# Patient Record
Sex: Female | Born: 1980 | Race: Black or African American | Hispanic: No | Marital: Single | Smoking: Current every day smoker
Health system: Southern US, Community
[De-identification: ages and names within clinical notes are randomized; demographics above are authoritative.]

## PROBLEM LIST (undated history)

## (undated) DIAGNOSIS — E049 Nontoxic goiter, unspecified: Secondary | ICD-10-CM

## (undated) DIAGNOSIS — I499 Cardiac arrhythmia, unspecified: Secondary | ICD-10-CM

## (undated) DIAGNOSIS — N809 Endometriosis, unspecified: Secondary | ICD-10-CM

---

## 2000-10-08 ENCOUNTER — Emergency Department (HOSPITAL_COMMUNITY): Admission: EM | Admit: 2000-10-08 | Discharge: 2000-10-08 | Payer: Self-pay | Admitting: Emergency Medicine

## 2004-07-10 ENCOUNTER — Emergency Department (HOSPITAL_COMMUNITY): Admission: EM | Admit: 2004-07-10 | Discharge: 2004-07-10 | Payer: Self-pay | Admitting: Emergency Medicine

## 2007-06-25 ENCOUNTER — Emergency Department (HOSPITAL_COMMUNITY): Admission: EM | Admit: 2007-06-25 | Discharge: 2007-06-25 | Payer: Self-pay | Admitting: Emergency Medicine

## 2009-11-03 ENCOUNTER — Emergency Department (HOSPITAL_COMMUNITY): Admission: EM | Admit: 2009-11-03 | Discharge: 2009-11-03 | Payer: Self-pay | Admitting: Emergency Medicine

## 2011-01-03 ENCOUNTER — Emergency Department (HOSPITAL_COMMUNITY)
Admission: EM | Admit: 2011-01-03 | Discharge: 2011-01-03 | Disposition: A | Discharge status: Home / Self Care | Payer: BC Managed Care – PPO | Attending: Emergency Medicine | Admitting: Emergency Medicine

## 2011-01-03 ENCOUNTER — Emergency Department (HOSPITAL_COMMUNITY): Payer: BC Managed Care – PPO

## 2011-01-03 DIAGNOSIS — Y9383 Activity, rough housing and horseplay: Secondary | ICD-10-CM | POA: Insufficient documentation

## 2011-01-03 DIAGNOSIS — S6390XA Sprain of unspecified part of unspecified wrist and hand, initial encounter: Secondary | ICD-10-CM | POA: Insufficient documentation

## 2011-01-03 DIAGNOSIS — S6990XA Unspecified injury of unspecified wrist, hand and finger(s), initial encounter: Secondary | ICD-10-CM | POA: Insufficient documentation

## 2011-01-03 DIAGNOSIS — Y92009 Unspecified place in unspecified non-institutional (private) residence as the place of occurrence of the external cause: Secondary | ICD-10-CM | POA: Insufficient documentation

## 2011-01-03 DIAGNOSIS — M79609 Pain in unspecified limb: Secondary | ICD-10-CM | POA: Insufficient documentation

## 2011-01-03 DIAGNOSIS — IMO0002 Reserved for concepts with insufficient information to code with codable children: Secondary | ICD-10-CM | POA: Insufficient documentation

## 2011-01-03 DIAGNOSIS — S6980XA Other specified injuries of unspecified wrist, hand and finger(s), initial encounter: Secondary | ICD-10-CM | POA: Insufficient documentation

## 2011-01-03 DIAGNOSIS — F319 Bipolar disorder, unspecified: Secondary | ICD-10-CM | POA: Insufficient documentation

## 2015-07-22 ENCOUNTER — Other Ambulatory Visit: Payer: Self-pay | Admitting: Obstetrics and Gynecology

## 2015-08-12 ENCOUNTER — Inpatient Hospital Stay (HOSPITAL_COMMUNITY): Admission: RE | Admit: 2015-08-12 | Payer: BLUE CROSS/BLUE SHIELD | Source: Ambulatory Visit

## 2015-08-12 ENCOUNTER — Inpatient Hospital Stay (HOSPITAL_COMMUNITY)
Admission: RE | Admit: 2015-08-12 | Discharge: 2015-08-12 | Disposition: A | Discharge status: Home / Self Care | Payer: Self-pay | Source: Ambulatory Visit

## 2015-08-12 NOTE — Patient Instructions (Addendum)
Your procedure is scheduled on: August 14, 2015  Enter through the Main Entrance of [facility] at: 11:30 am   Pick up the phone at the desk and dial [phone].  Call this number if you have problems the morning of surgery: [phone].  Remember: Do NOT eat food: after midnight on Wednesday  Do NOT drink clear liquids after:  9:00am day of surgery  Take these medicines the morning of surgery with a SIP OF WATER: none   Do NOT wear jewelry (body piercing), metal hair clips/bobby pins, make-up, or nail polish. Do NOT wear lotions, powders, or perfumes.  You may wear deoderant. Do NOT shave for 48 hours prior to surgery. Do NOT bring valuables to the hospital. Contacts, dentures, or bridgework may not be worn into surgery. Leave suitcase in car.  After surgery it may be brought to your room.  For patients admitted to the hospital, checkout time is 11:00 AM the day of discharge.

## 2015-08-13 ENCOUNTER — Encounter (HOSPITAL_COMMUNITY): Payer: Self-pay

## 2015-08-13 ENCOUNTER — Encounter (HOSPITAL_COMMUNITY)
Admission: RE | Admit: 2015-08-13 | Discharge: 2015-08-13 | Disposition: A | Discharge status: Home / Self Care | Payer: BLUE CROSS/BLUE SHIELD | Source: Ambulatory Visit | Attending: Obstetrics and Gynecology | Admitting: Obstetrics and Gynecology

## 2015-08-13 HISTORY — DX: Endometriosis, unspecified: N80.9

## 2015-08-13 HISTORY — DX: Cardiac arrhythmia, unspecified: I49.9

## 2015-08-13 HISTORY — DX: Nontoxic goiter, unspecified: E04.9

## 2015-08-13 LAB — CBC
HEMATOCRIT: 37.1 % (ref 36.0–46.0)
Hemoglobin: 12.3 g/dL (ref 12.0–15.0)
MCH: 26.9 pg (ref 26.0–34.0)
MCHC: 33.2 g/dL (ref 30.0–36.0)
MCV: 81.2 fL (ref 78.0–100.0)
PLATELETS: 232 10*3/uL (ref 150–400)
RBC: 4.57 MIL/uL (ref 3.87–5.11)
RDW: 14.1 % (ref 11.5–15.5)
WBC: 9 10*3/uL (ref 4.0–10.5)

## 2015-08-13 LAB — ABO/RH: ABO/RH(D): O POS

## 2015-08-13 NOTE — Patient Instructions (Signed)
Your procedure is scheduled on:  Tomorrow, Dec. 1, 2016  Enter through the [employer] [facility] at:  11:30 A.M.  Pick up the phone at the desk and dial [phone].  Call this number if you have problems the morning of surgery: [phone].  Remember: Do NOT eat food:  After midnight tonight Do NOT drink clear liquids after:  9:00 A.M. Day of surgery Take these medicines the morning of surgery with a SIP OF WATER:  None  Do NOT wear jewelry (body piercing), metal hair clips/bobby pins, make-up, or nail polish. Do NOT wear lotions, powders, or perfumes.  You may wear deoderant. Do NOT shave for 48 hours prior to surgery. Do NOT bring valuables to the hospital. Contacts, dentures, or bridgework may not be worn into surgery. Leave suitcase in car.  After surgery it may be brought to your room.  For patients admitted to the hospital, checkout time is 11:00 AM the day of discharge.

## 2015-08-14 ENCOUNTER — Encounter (HOSPITAL_COMMUNITY): Payer: Self-pay | Admitting: *Deleted

## 2015-08-14 ENCOUNTER — Inpatient Hospital Stay (HOSPITAL_COMMUNITY): Payer: BLUE CROSS/BLUE SHIELD | Admitting: Anesthesiology

## 2015-08-14 ENCOUNTER — Inpatient Hospital Stay (HOSPITAL_COMMUNITY)
Admission: RE | Admit: 2015-08-14 | Discharge: 2015-08-17 | DRG: 337 | Disposition: A | Discharge status: Home / Self Care | Payer: BLUE CROSS/BLUE SHIELD | Source: Ambulatory Visit | Attending: Obstetrics and Gynecology | Admitting: Obstetrics and Gynecology

## 2015-08-14 ENCOUNTER — Encounter (HOSPITAL_COMMUNITY)
Admission: RE | Disposition: A | Discharge status: Home / Self Care | Payer: Self-pay | Source: Ambulatory Visit | Attending: Obstetrics and Gynecology

## 2015-08-14 DIAGNOSIS — K660 Peritoneal adhesions (postprocedural) (postinfection): Secondary | ICD-10-CM | POA: Diagnosis present

## 2015-08-14 DIAGNOSIS — N803 Endometriosis of pelvic peritoneum: Secondary | ICD-10-CM | POA: Diagnosis present

## 2015-08-14 DIAGNOSIS — N83202 Unspecified ovarian cyst, left side: Secondary | ICD-10-CM | POA: Diagnosis present

## 2015-08-14 DIAGNOSIS — N83201 Unspecified ovarian cyst, right side: Secondary | ICD-10-CM | POA: Diagnosis present

## 2015-08-14 DIAGNOSIS — N736 Female pelvic peritoneal adhesions (postinfective): Secondary | ICD-10-CM | POA: Diagnosis present

## 2015-08-14 DIAGNOSIS — K36 Other appendicitis: Secondary | ICD-10-CM | POA: Diagnosis present

## 2015-08-14 DIAGNOSIS — D259 Leiomyoma of uterus, unspecified: Secondary | ICD-10-CM | POA: Diagnosis present

## 2015-08-14 DIAGNOSIS — Z9049 Acquired absence of other specified parts of digestive tract: Secondary | ICD-10-CM

## 2015-08-14 HISTORY — PX: APPENDECTOMY: SHX54

## 2015-08-14 HISTORY — PX: LAPAROTOMY: SHX154

## 2015-08-14 LAB — CBC
HEMATOCRIT: 30.1 % — AB (ref 36.0–46.0)
HEMOGLOBIN: 9.7 g/dL — AB (ref 12.0–15.0)
MCH: 26.6 pg (ref 26.0–34.0)
MCHC: 32.2 g/dL (ref 30.0–36.0)
MCV: 82.5 fL (ref 78.0–100.0)
Platelets: 215 10*3/uL (ref 150–400)
RBC: 3.65 MIL/uL — ABNORMAL LOW (ref 3.87–5.11)
RDW: 14 % (ref 11.5–15.5)
WBC: 16.1 10*3/uL — ABNORMAL HIGH (ref 4.0–10.5)

## 2015-08-14 LAB — PREPARE RBC (CROSSMATCH)

## 2015-08-14 SURGERY — APPENDECTOMY
Anesthesia: General | Site: Abdomen

## 2015-08-14 MED ORDER — BUPIVACAINE HCL (PF) 0.25 % IJ SOLN
INTRAMUSCULAR | Status: AC
  Filled 2015-08-14: qty 30

## 2015-08-14 MED ORDER — DEXAMETHASONE SODIUM PHOSPHATE 4 MG/ML IJ SOLN
INTRAMUSCULAR | Status: DC | PRN
  Administered 2015-08-14: 10 mg via INTRAVENOUS

## 2015-08-14 MED ORDER — SCOPOLAMINE 1 MG/3DAYS TD PT72
MEDICATED_PATCH | TRANSDERMAL | Status: AC
  Administered 2015-08-14: 1.5 mg via TRANSDERMAL
  Filled 2015-08-14: qty 1

## 2015-08-14 MED ORDER — PROMETHAZINE HCL 25 MG/ML IJ SOLN: 6.2500 mg | INTRAMUSCULAR | Status: DC | PRN

## 2015-08-14 MED ORDER — FENTANYL CITRATE (PF) 250 MCG/5ML IJ SOLN
INTRAMUSCULAR | Status: AC
  Filled 2015-08-14: qty 5

## 2015-08-14 MED ORDER — SCOPOLAMINE 1 MG/3DAYS TD PT72
1.0000 | MEDICATED_PATCH | Freq: Once | TRANSDERMAL | Status: DC
  Administered 2015-08-14: 1.5 mg via TRANSDERMAL

## 2015-08-14 MED ORDER — GLYCOPYRROLATE 0.2 MG/ML IJ SOLN
INTRAMUSCULAR | Status: DC | PRN
  Administered 2015-08-14: 0.1 mg via INTRAVENOUS
  Administered 2015-08-14: 0.6 mg via INTRAVENOUS

## 2015-08-14 MED ORDER — LIDOCAINE HCL (CARDIAC) 20 MG/ML IV SOLN
INTRAVENOUS | Status: DC | PRN
  Administered 2015-08-14: 100 mg via INTRAVENOUS

## 2015-08-14 MED ORDER — MIDAZOLAM HCL 2 MG/2ML IJ SOLN
INTRAMUSCULAR | Status: DC | PRN
  Administered 2015-08-14: 2 mg via INTRAVENOUS

## 2015-08-14 MED ORDER — NALOXONE HCL 0.4 MG/ML IJ SOLN: 0.4000 mg | INTRAMUSCULAR | Status: DC | PRN

## 2015-08-14 MED ORDER — SIMETHICONE 80 MG PO CHEW
80.0000 mg | CHEWABLE_TABLET | Freq: Four times a day (QID) | ORAL | Status: DC | PRN
  Administered 2015-08-15 – 2015-08-16 (×2): 80 mg via ORAL
  Filled 2015-08-14 (×2): qty 1

## 2015-08-14 MED ORDER — ACETAMINOPHEN 10 MG/ML IV SOLN
1000.0000 mg | Freq: Once | INTRAVENOUS | Status: AC
  Administered 2015-08-14: 1000 mg via INTRAVENOUS
  Filled 2015-08-14: qty 100

## 2015-08-14 MED ORDER — HYDROMORPHONE HCL 1 MG/ML IJ SOLN: 0.2000 mg | INTRAMUSCULAR | Status: DC | PRN

## 2015-08-14 MED ORDER — NEOSTIGMINE METHYLSULFATE 10 MG/10ML IV SOLN
INTRAVENOUS | Status: DC | PRN
  Administered 2015-08-14: 3 mg via INTRAVENOUS

## 2015-08-14 MED ORDER — FENTANYL CITRATE (PF) 100 MCG/2ML IJ SOLN: INTRAMUSCULAR | Status: DC | PRN

## 2015-08-14 MED ORDER — ONDANSETRON HCL 4 MG/2ML IJ SOLN: 4.0000 mg | Freq: Four times a day (QID) | INTRAMUSCULAR | Status: DC | PRN

## 2015-08-14 MED ORDER — HYDROMORPHONE HCL 1 MG/ML IJ SOLN
INTRAMUSCULAR | Status: DC | PRN
  Administered 2015-08-14 (×4): 1 mg via INTRAVENOUS

## 2015-08-14 MED ORDER — LACTATED RINGERS IV SOLN
INTRAVENOUS | Status: DC
  Administered 2015-08-14: 125 mL/h via INTRAVENOUS
  Administered 2015-08-14: 20:00:00 via INTRAVENOUS

## 2015-08-14 MED ORDER — ONDANSETRON HCL 4 MG/2ML IJ SOLN
INTRAMUSCULAR | Status: DC | PRN
  Administered 2015-08-14: 4 mg via INTRAVENOUS

## 2015-08-14 MED ORDER — LIDOCAINE HCL (CARDIAC) 20 MG/ML IV SOLN
INTRAVENOUS | Status: AC
  Filled 2015-08-14: qty 5

## 2015-08-14 MED ORDER — CEFAZOLIN SODIUM-DEXTROSE 2-3 GM-% IV SOLR
2.0000 g | INTRAVENOUS | Status: AC
  Administered 2015-08-14: 2 g via INTRAVENOUS

## 2015-08-14 MED ORDER — LACTATED RINGERS IV SOLN
INTRAVENOUS | Status: DC
  Administered 2015-08-14 (×5): via INTRAVENOUS

## 2015-08-14 MED ORDER — ONDANSETRON HCL 4 MG PO TABS: 4.0000 mg | ORAL_TABLET | Freq: Four times a day (QID) | ORAL | Status: DC | PRN

## 2015-08-14 MED ORDER — DIPHENHYDRAMINE HCL 50 MG/ML IJ SOLN: 12.5000 mg | Freq: Four times a day (QID) | INTRAMUSCULAR | Status: DC | PRN

## 2015-08-14 MED ORDER — NEOSTIGMINE METHYLSULFATE 10 MG/10ML IV SOLN
INTRAVENOUS | Status: AC
  Filled 2015-08-14: qty 1

## 2015-08-14 MED ORDER — ROCURONIUM BROMIDE 100 MG/10ML IV SOLN
INTRAVENOUS | Status: DC | PRN
  Administered 2015-08-14 (×3): 10 mg via INTRAVENOUS
  Administered 2015-08-14: 5 mg via INTRAVENOUS
  Administered 2015-08-14: 10 mg via INTRAVENOUS
  Administered 2015-08-14: 35 mg via INTRAVENOUS

## 2015-08-14 MED ORDER — PROPOFOL 10 MG/ML IV BOLUS
INTRAVENOUS | Status: AC
Start: 2015-08-14 — End: 2015-08-14
  Filled 2015-08-14: qty 20

## 2015-08-14 MED ORDER — PROPOFOL 10 MG/ML IV BOLUS
INTRAVENOUS | Status: DC | PRN
  Administered 2015-08-14: 200 mg via INTRAVENOUS

## 2015-08-14 MED ORDER — HYDROMORPHONE HCL 1 MG/ML IJ SOLN
INTRAMUSCULAR | Status: AC
  Filled 2015-08-14: qty 1

## 2015-08-14 MED ORDER — MEPERIDINE HCL 25 MG/ML IJ SOLN: 6.2500 mg | INTRAMUSCULAR | Status: DC | PRN

## 2015-08-14 MED ORDER — FENTANYL CITRATE (PF) 100 MCG/2ML IJ SOLN
INTRAMUSCULAR | Status: DC | PRN
  Administered 2015-08-14: 100 ug via INTRAVENOUS

## 2015-08-14 MED ORDER — PANTOPRAZOLE SODIUM 40 MG PO TBEC
40.0000 mg | DELAYED_RELEASE_TABLET | Freq: Every day | ORAL | Status: DC
  Administered 2015-08-15 – 2015-08-16 (×2): 40 mg via ORAL
  Filled 2015-08-14 (×2): qty 1

## 2015-08-14 MED ORDER — DEXAMETHASONE SODIUM PHOSPHATE 10 MG/ML IJ SOLN
INTRAMUSCULAR | Status: AC
  Filled 2015-08-14: qty 1

## 2015-08-14 MED ORDER — FENTANYL CITRATE (PF) 100 MCG/2ML IJ SOLN
INTRAMUSCULAR | Status: AC
  Filled 2015-08-14: qty 2

## 2015-08-14 MED ORDER — SODIUM CHLORIDE 0.9 % IJ SOLN: 9.0000 mL | INTRAMUSCULAR | Status: DC | PRN

## 2015-08-14 MED ORDER — CEFAZOLIN SODIUM-DEXTROSE 2-3 GM-% IV SOLR
INTRAVENOUS | Status: AC
  Filled 2015-08-14: qty 50

## 2015-08-14 MED ORDER — HYDROMORPHONE HCL 1 MG/ML IJ SOLN
INTRAMUSCULAR | Status: AC
  Filled 2015-08-14: qty 2

## 2015-08-14 MED ORDER — VASOPRESSIN 20 UNIT/ML IV SOLN
INTRAVENOUS | Status: AC
  Filled 2015-08-14: qty 1

## 2015-08-14 MED ORDER — HYDROMORPHONE HCL 1 MG/ML IJ SOLN
0.2500 mg | INTRAMUSCULAR | Status: DC | PRN
  Administered 2015-08-14: 0.25 mg via INTRAVENOUS
  Administered 2015-08-14 (×2): 0.5 mg via INTRAVENOUS
  Administered 2015-08-14: 0.25 mg via INTRAVENOUS

## 2015-08-14 MED ORDER — METOCLOPRAMIDE HCL 5 MG/ML IJ SOLN
INTRAMUSCULAR | Status: DC | PRN
  Administered 2015-08-14: 10 mg via INTRAVENOUS

## 2015-08-14 MED ORDER — METOCLOPRAMIDE HCL 5 MG/ML IJ SOLN
INTRAMUSCULAR | Status: AC
  Filled 2015-08-14: qty 2

## 2015-08-14 MED ORDER — MIDAZOLAM HCL 2 MG/2ML IJ SOLN
INTRAMUSCULAR | Status: AC
  Filled 2015-08-14: qty 2

## 2015-08-14 MED ORDER — HYDROMORPHONE 1 MG/ML IV SOLN
INTRAVENOUS | Status: DC
  Administered 2015-08-14: 20:00:00 via INTRAVENOUS
  Administered 2015-08-14: 2.6 mg via INTRAVENOUS
  Administered 2015-08-15: 3 mg via INTRAVENOUS
  Administered 2015-08-15: 2.4 mg via INTRAVENOUS
  Filled 2015-08-14: qty 25

## 2015-08-14 MED ORDER — GLYCOPYRROLATE 0.2 MG/ML IJ SOLN
INTRAMUSCULAR | Status: AC
  Filled 2015-08-14: qty 3

## 2015-08-14 MED ORDER — DEXTROSE IN LACTATED RINGERS 5 % IV SOLN
INTRAVENOUS | Status: DC
  Administered 2015-08-15: 04:00:00 via INTRAVENOUS

## 2015-08-14 MED ORDER — ROCURONIUM BROMIDE 100 MG/10ML IV SOLN
INTRAVENOUS | Status: AC
  Filled 2015-08-14: qty 1

## 2015-08-14 MED ORDER — HYDROMORPHONE HCL 1 MG/ML IJ SOLN
INTRAMUSCULAR | Status: AC
  Administered 2015-08-14: 0.5 mg via INTRAVENOUS
  Filled 2015-08-14: qty 1

## 2015-08-14 MED ORDER — ONDANSETRON HCL 4 MG/2ML IJ SOLN
INTRAMUSCULAR | Status: AC
  Filled 2015-08-14: qty 2

## 2015-08-14 MED ORDER — DIPHENHYDRAMINE HCL 12.5 MG/5ML PO ELIX
12.5000 mg | ORAL_SOLUTION | Freq: Four times a day (QID) | ORAL | Status: DC | PRN
  Administered 2015-08-15: 12.5 mg via ORAL
  Filled 2015-08-14: qty 5

## 2015-08-14 MED ORDER — FENTANYL CITRATE (PF) 250 MCG/5ML IJ SOLN
INTRAMUSCULAR | Status: DC | PRN
  Administered 2015-08-14: 100 ug via INTRAVENOUS
  Administered 2015-08-14 (×2): 50 ug via INTRAVENOUS
  Administered 2015-08-14 (×2): 100 ug via INTRAVENOUS
  Administered 2015-08-14 (×2): 50 ug via INTRAVENOUS

## 2015-08-14 MED ORDER — MENTHOL 3 MG MT LOZG
1.0000 | LOZENGE | OROMUCOSAL | Status: DC | PRN
  Administered 2015-08-15: 3 mg via ORAL
  Filled 2015-08-14: qty 9

## 2015-08-14 MED ORDER — VASOPRESSIN 20 UNIT/ML IV SOLN
INTRAVENOUS | Status: AC
  Filled 2015-08-14: qty 3

## 2015-08-14 MED ORDER — SODIUM CHLORIDE 0.9 % IJ SOLN
INTRAMUSCULAR | Status: AC
  Filled 2015-08-14: qty 100

## 2015-08-14 MED ORDER — DEXTROSE IN LACTATED RINGERS 5 % IV SOLN: INTRAVENOUS | Status: DC

## 2015-08-14 MED ORDER — BUPIVACAINE HCL (PF) 0.25 % IJ SOLN
INTRAMUSCULAR | Status: DC | PRN
  Administered 2015-08-14: 10 mL

## 2015-08-14 SURGICAL SUPPLY — 54 items
APL SKNCLS STERI-STRIP NONHPOA (GAUZE/BANDAGES/DRESSINGS) ×3
BARRIER ADHS 3X4 INTERCEED (GAUZE/BANDAGES/DRESSINGS) ×5 IMPLANT
BENZOIN TINCTURE PRP APPL 2/3 (GAUZE/BANDAGES/DRESSINGS) ×4 IMPLANT
BRR ADH 4X3 ABS CNTRL BYND (GAUZE/BANDAGES/DRESSINGS) ×3
CANISTER SUCT 3000ML (MISCELLANEOUS) ×5 IMPLANT
CLIP TI MEDIUM 6 (CLIP) ×8 IMPLANT
CLOSURE WOUND 1/2 X4 (GAUZE/BANDAGES/DRESSINGS) ×1
CLOTH BEACON ORANGE TIMEOUT ST (SAFETY) ×5 IMPLANT
CONT PATH 16OZ SNAP LID 3702 (MISCELLANEOUS) ×5 IMPLANT
CONT SPEC PATH 64OZ SNAP LID (MISCELLANEOUS) ×5 IMPLANT
DECANTER SPIKE VIAL GLASS SM (MISCELLANEOUS) ×9 IMPLANT
DRAPE CESAREAN BIRTH W POUCH (DRAPES) ×5 IMPLANT
DRSG OPSITE POSTOP 4X10 (GAUZE/BANDAGES/DRESSINGS) ×5 IMPLANT
DURAPREP 26ML APPLICATOR (WOUND CARE) ×5 IMPLANT
ELECT NDL TIP 2.8 STRL (NEEDLE) ×1 IMPLANT
ELECT NEEDLE TIP 2.8 STRL (NEEDLE) ×5 IMPLANT
FILTER STRAW FLUID ASPIR (MISCELLANEOUS) IMPLANT
GAUZE SPONGE 4X4 16PLY XRAY LF (GAUZE/BANDAGES/DRESSINGS) ×5 IMPLANT
GLOVE BIOGEL PI IND STRL 7.0 (GLOVE) ×9 IMPLANT
GLOVE BIOGEL PI INDICATOR 7.0 (GLOVE) ×6
GLOVE ECLIPSE 6.5 STRL STRAW (GLOVE) ×5 IMPLANT
GOWN STRL REUS W/TWL LRG LVL3 (GOWN DISPOSABLE) ×15 IMPLANT
NDL SPNL 22GX3.5 QUINCKE BK (NEEDLE) ×1 IMPLANT
NEEDLE HYPO 22GX1.5 SAFETY (NEEDLE) ×5 IMPLANT
NEEDLE SPNL 22GX3.5 QUINCKE BK (NEEDLE) ×5 IMPLANT
NS IRRIG 1000ML POUR BTL (IV SOLUTION) ×5 IMPLANT
PACK ABDOMINAL GYN (CUSTOM PROCEDURE TRAY) ×5 IMPLANT
PAD OB MATERNITY 4.3X12.25 (PERSONAL CARE ITEMS) ×5 IMPLANT
RETRACTOR WND ALEXIS 25 LRG (MISCELLANEOUS) ×2 IMPLANT
RTRCTR WOUND ALEXIS 25CM LRG (MISCELLANEOUS) ×5
SPONGE GAUZE 4X4 12PLY STER LF (GAUZE/BANDAGES/DRESSINGS) ×5 IMPLANT
STAPLER GUN LINEAR PROX 60 (STAPLE) ×4 IMPLANT
STAPLER VISISTAT 35W (STAPLE) IMPLANT
STRIP CLOSURE SKIN 1/2X4 (GAUZE/BANDAGES/DRESSINGS) ×3 IMPLANT
SURGIFLO W/THROMBIN 8M KIT (HEMOSTASIS) ×4 IMPLANT
SUT MON AB 3-0 SH 27 (SUTURE) ×15
SUT MON AB 3-0 SH27 (SUTURE) ×9 IMPLANT
SUT MON AB 4-0 PS1 27 (SUTURE) ×10 IMPLANT
SUT PLAIN 2 0 (SUTURE) ×5
SUT PLAIN ABS 2-0 CT1 27XMFL (SUTURE) ×2 IMPLANT
SUT SILK 3 0 SH CR/8 (SUTURE) ×4 IMPLANT
SUT VIC AB 0 CT1 18XCR BRD8 (SUTURE) ×3 IMPLANT
SUT VIC AB 0 CT1 36 (SUTURE) ×10 IMPLANT
SUT VIC AB 0 CT1 8-18 (SUTURE) ×5
SUT VIC AB 2-0 CT1 27 (SUTURE) ×5
SUT VIC AB 2-0 CT1 TAPERPNT 27 (SUTURE) ×3 IMPLANT
SUT VIC AB 2-0 SH 27 (SUTURE)
SUT VIC AB 2-0 SH 27XBRD (SUTURE) IMPLANT
SUT VIC AB 4-0 PS2 27 (SUTURE) IMPLANT
SUT VICRYL 0 TIES 12 18 (SUTURE) ×4 IMPLANT
SUT VICRYL 4-0 PS2 18IN ABS (SUTURE) ×4 IMPLANT
SYR CONTROL 10ML LL (SYRINGE) ×6 IMPLANT
TOWEL OR 17X24 6PK STRL BLUE (TOWEL DISPOSABLE) ×10 IMPLANT
TRAY FOLEY CATH SILVER 14FR (SET/KITS/TRAYS/PACK) ×5 IMPLANT

## 2015-08-14 NOTE — Op Note (Signed)
Preoperative Diagnosis: severe intra-abdominal adhesions and probable chronic appendicitis  Postoprative Diagnosis: same  Procedure: Procedure(s): Intraoperative consult, extensive lysis of pelvic adhesions, appendectomy   Surgeon: Excell Seltzer T   Assistants: Garwin Brothers  Anesthesia:  General endotracheal anesthesia  Indications: I was consulted intraoperatively by Dr. Garwin Brothers at the time of planned myomectomy and ovarian cystectomy due to severe bowel adhesions in the pelvis.    Procedure Detail: the patient had been explored through a Pfannenstiel incision with adequate exposure of the pelvis and lower abdomen. There was noted to be a large fibroid uterus as well as bilateral ovarian cysts. Initially apparent were dense adhesions of the cecum and terminal ileum to the right adnexa and down into the cul-de-sac. Using careful sharp dissection the cecum and terminal ileum were dissected away from the right adnexa and with further dissection there was an obvious dense adhesion of the appendix down into the cul-de-sac on the right side. The cecum and terminal ileum were completely freed down to the tip of the cecum and I exposed the base of the appendix adjacent to the ovary and tube. Using then mostly blunt dissection the appendix was mobilized up out of the pelvis completely freeing the terminal ileum and cecum. The appendix was distinctly abnormal, thickened and foreshortened with some thickening or mass effect extending back into the tip of the cecum. Appendectomy was performed dividing what remained of the mesial appendix between clamps and tied with 2-0 silk ties and completely freeing the appendix down to the tip of the cecum. I was unable to place a TA 60 blue load stapler across the cecum proximal to the thickened area but still clearly staying away from the ileocecal valve. This was used to remove the appendix and the tip of the cecum with the staple line intact and without bleeding.  Attention was then turned to the sigmoid and rectosigmoid which was somewhat adherent to the left adnexa. I divided lateral peritoneal attachments above the area of adhesions and the distal sigmoid dissected up out of the retroperitoneum. The ureter was identified and protected. More superficial adhesions to the adnexa and also to the back of the uterus were then sharply divided. As we dissected distally the rectum was very adherent to the back of the uterus with obliteration of the cul-de-sac. With very slow tedious dissection the rectum was able to be separated from the uterus working down toward the cervix. I then entered a fairly soft area in the midline and the rectum was dissected away from the lower uterus and cervix particular on the left side although there remained some very difficult adhesions very low on the uterus on the right side adjacent to where the appendix had been adherent in the cul-de-sac. At this point I felt that taking these down further with limited visibility behind the enlarged uterus with entail some risk and Dr. Garwin Brothers felt that she had enough exposure that she could adequately proceed with her planned procedure. I sure there was no bleeding. I carefully examined the rectosigmoid and I did not see any evidence of injury. Total time for the lysis of adhesions only was approximately 1 hour. Dr. Garwin Brothers then proceeded with her planned procedure.    Findings: As above  Estimated Blood Loss:  200 mL         Drains: none  Blood Given: none          Specimens: appendix        Complications:  * No complications entered in OR  log *         Disposition: PACU - hemodynamically stable.         Condition: stable

## 2015-08-14 NOTE — Transfer of Care (Signed)
Immediate Anesthesia Transfer of Care Note  Patient: Amber Conley  Procedure(s) Performed: Procedure(s): APPENDECTOMY (N/A) EXPLORATORY LAPAROTOMY WITH LYSIS OF ADHESIONS (N/A)  Patient Location: PACU  Anesthesia Type:General  Level of Consciousness: awake, alert  and oriented  Airway & Oxygen Therapy: Patient Spontanous Breathing and Patient connected to nasal cannula oxygen  Post-op Assessment: Report given to RN and Post -op Vital signs reviewed and stable  Post vital signs: Reviewed and stable  Last Vitals:  Filed Vitals:   08/14/15 1135  BP: 119/94  Pulse: 92  Temp: 36.6 C  Resp: 20    Complications: No apparent anesthesia complications

## 2015-08-14 NOTE — Anesthesia Preprocedure Evaluation (Signed)
Anesthesia Evaluation  Patient identified by MRN, date of birth, ID band Patient awake    Reviewed: Allergy & Precautions, NPO status , Patient's Chart, lab work & pertinent test results  Airway Mallampati: II  TM Distance: >3 FB Neck ROM: Full    Dental   Pulmonary Current Smoker,    breath sounds clear to auscultation       Cardiovascular + dysrhythmias  Rhythm:Regular Rate:Normal     Neuro/Psych negative neurological ROS  negative psych ROS   GI/Hepatic negative GI ROS, Neg liver ROS,   Endo/Other  negative endocrine ROS  Renal/GU negative Renal ROS  negative genitourinary   Musculoskeletal negative musculoskeletal ROS (+)   Abdominal   Peds  Hematology negative hematology ROS (+)   Anesthesia Other Findings   Reproductive/Obstetrics negative OB ROS                             Lab Results  Component Value Date   WBC 9.0 08/13/2015   HGB 12.3 08/13/2015   HCT 37.1 08/13/2015   MCV 81.2 08/13/2015   PLT 232 08/13/2015   No results found for: CREATININE, BUN, NA, K, CL, CO2 No results found for: INR, PROTIME  Anesthesia Physical Anesthesia Plan  ASA: II  Anesthesia Plan: General   Post-op Pain Management:    Induction: Intravenous  Airway Management Planned: Oral ETT  Additional Equipment:   Intra-op Plan:   Post-operative Plan: Extubation in OR  Informed Consent: I have reviewed the patients History and Physical, chart, labs and discussed the procedure including the risks, benefits and alternatives for the proposed anesthesia with the patient or authorized representative who has indicated his/her understanding and acceptance.   Dental advisory given  Plan Discussed with: CRNA  Anesthesia Plan Comments:         Anesthesia Quick Evaluation

## 2015-08-14 NOTE — Brief Op Note (Signed)
08/14/2015  5:35 PM  PATIENT:  Amber Conley  34 y.o. female  PRE-OPERATIVE DIAGNOSIS:  Fibroid Uterus, Dysmenorrhea, Bilateral Complex Ovarian Cysts  POST-OPERATIVE DIAGNOSIS:  Stage IV pelvic endometriosis, chronic appendicitis, fibroid uterus  PROCEDURE:  Procedure(s): APPENDECTOMY (N/A) EXPLORATORY LAPAROTOMY WITH LYSIS OF ADHESIONS (N/A)  SURGEON:  Surgeon(s) and Role:    * Servando Salina, MD - Primary    * Excell Seltzer, MD - Assisting  PHYSICIAN ASSISTANT:   ASSISTANTS: Artelia Laroche, CNM   ANESTHESIA:   general  Findings: obliterated cul des sac, abnl appendix adherent to right adnexa, fibroid uterus, left ovary adherent to left fundal area and l;eft side wall, right ovary adherent to right pelvic side wall, left ureter seen EBL:  Total I/O In: 4800 [I.V.:4800] Out: 1550 [Urine:200; Blood:1350]  BLOOD ADMINISTERED:none  DRAINS: none   LOCAL MEDICATIONS USED:  MARCAINE     SPECIMEN:  Source of Specimen:  appendix  DISPOSITION OF SPECIMEN:  PATHOLOGY  COUNTS:  YES  TOURNIQUET:  * No tourniquets in log *  DICTATION: .Other Dictation: Dictation Number [id number]  PLAN OF CARE: Admit to inpatient   PATIENT DISPOSITION:  PACU - hemodynamically stable.   Delay start of Pharmacological VTE agent (>24hrs) due to surgical blood loss or risk of bleeding: no

## 2015-08-14 NOTE — Anesthesia Postprocedure Evaluation (Signed)
Anesthesia Post Note  Patient: [name]  Procedure(s) Performed: Procedure(s) (LRB): APPENDECTOMY (N/A) EXPLORATORY LAPAROTOMY WITH LYSIS OF ADHESIONS (N/A)  Patient location during evaluation: [facility] Anesthesia Type: General Level of consciousness: awake Pain management: pain level controlled Vital Signs Assessment: post-procedure vital signs reviewed and stable Respiratory status: spontaneous breathing Cardiovascular status: stable Postop Assessment: no signs of nausea or vomiting and adequate PO intake Anesthetic complications: no    Last Vitals:  Filed Vitals:   08/14/15 1915 08/14/15 1930  BP: 108/69 119/78  Pulse: 106 94  Temp: 37.3 C   Resp: 17 22    Last Pain:  Filed Vitals:   08/14/15 1934  PainSc: Amber Conley

## 2015-08-14 NOTE — Anesthesia Procedure Notes (Signed)
Procedure Name: Intubation Date/Time: 08/14/2015 1:22 PM Performed by: Flossie Dibble Pre-anesthesia Checklist: Patient identified, Timeout performed, Emergency Drugs available, Suction available and Patient being monitored Patient Re-evaluated:Patient Re-evaluated prior to inductionOxygen Delivery Method: Circle system utilized Preoxygenation: Pre-oxygenation with 100% oxygen Intubation Type: IV induction Ventilation: Mask ventilation without difficulty Laryngoscope Size: Mac and 3 Grade View: Grade I Tube type: Oral Tube size: 7.0 mm Number of attempts: 1 Airway Equipment and Method: Stylet Placement Confirmation: ETT inserted through vocal cords under direct vision,  positive ETCO2 and breath sounds checked- equal and bilateral Secured at: 21 cm Tube secured with: Tape Dental Injury: Teeth and Oropharynx as per pre-operative assessment

## 2015-08-15 ENCOUNTER — Encounter (HOSPITAL_COMMUNITY): Payer: Self-pay | Admitting: Obstetrics and Gynecology

## 2015-08-15 LAB — BASIC METABOLIC PANEL
ANION GAP: 5 (ref 5–15)
BUN: 9 mg/dL (ref 6–20)
CALCIUM: 8.4 mg/dL — AB (ref 8.9–10.3)
CO2: 26 mmol/L (ref 22–32)
Chloride: 107 mmol/L (ref 101–111)
Creatinine, Ser: 0.79 mg/dL (ref 0.44–1.00)
Glucose, Bld: 147 mg/dL — ABNORMAL HIGH (ref 65–99)
Potassium: 3.9 mmol/L (ref 3.5–5.1)
Sodium: 138 mmol/L (ref 135–145)

## 2015-08-15 LAB — CBC
HCT: 25.3 % — ABNORMAL LOW (ref 36.0–46.0)
Hemoglobin: 8.4 g/dL — ABNORMAL LOW (ref 12.0–15.0)
MCH: 27.2 pg (ref 26.0–34.0)
MCHC: 33.2 g/dL (ref 30.0–36.0)
MCV: 81.9 fL (ref 78.0–100.0)
PLATELETS: 171 10*3/uL (ref 150–400)
RBC: 3.09 MIL/uL — AB (ref 3.87–5.11)
RDW: 13.9 % (ref 11.5–15.5)
WBC: 9.6 10*3/uL (ref 4.0–10.5)

## 2015-08-15 MED ORDER — NICOTINE 21 MG/24HR TD PT24
21.0000 mg | MEDICATED_PATCH | Freq: Every day | TRANSDERMAL | Status: DC
  Administered 2015-08-16: 21 mg via TRANSDERMAL
  Filled 2015-08-15 (×4): qty 1

## 2015-08-15 MED ORDER — ALPRAZOLAM 0.25 MG PO TABS
0.2500 mg | ORAL_TABLET | Freq: Three times a day (TID) | ORAL | Status: DC | PRN
  Administered 2015-08-15 – 2015-08-17 (×3): 0.25 mg via ORAL
  Filled 2015-08-15 (×3): qty 1

## 2015-08-15 MED ORDER — ACETAMINOPHEN-CODEINE #3 300-30 MG PO TABS
2.0000 | ORAL_TABLET | ORAL | Status: DC | PRN
  Administered 2015-08-15 – 2015-08-16 (×9): 2 via ORAL
  Administered 2015-08-17: 1 via ORAL
  Administered 2015-08-17: 2 via ORAL
  Filled 2015-08-15 (×3): qty 2
  Filled 2015-08-15 (×2): qty 1
  Filled 2015-08-15 (×3): qty 2
  Filled 2015-08-15: qty 1
  Filled 2015-08-15 (×3): qty 2

## 2015-08-15 NOTE — Op Note (Signed)
Amber Conley, Amber Conley              ACCOUNT NO.:  [id number]  MEDICAL RECORD NO.:  [id number]  LOCATION:  9305                          FACILITY:  [facility]  PHYSICIAN:  Servando Salina, M.D.DATE OF BIRTH:  August 22, 1981  DATE OF PROCEDURE:  08/14/2015 DATE OF DISCHARGE:                              OPERATIVE REPORT   PREOPERATIVE DIAGNOSES:  Fibroid uterus, dysmenorrhea, bilateral complex ovarian cyst.  POSTOPERATIVE DIAGNOSES:  Stage IV pelvic endometriosis, chronic appendicitis, fibroid uterus, and dysmenorrhea.  PROCEDURE:  Exploratory laparotomy, lysis of adhesions.  ANESTHESIA:  General.  SURGEON:  Servando Salina, MD.  ASSISTANTArtelia Laroche.  INTRAOPERATIVE CONSULTANT:  Marland Kitchen T. Hoxworth, M.D.  DESCRIPTION OF PROCEDURE:  Under adequate general anesthesia, the patient was placed in the supine position.  She was sterilely prepped and draped in usual fashion.  An indwelling Foley catheter was sterilely placed.  A 0.25% Marcaine was injected along the planned Pfannenstiel skin incision site.  Pfannenstiel skin incision was then made carried down to the rectus fascia.  Rectus fascia opened transversely. Rectus fascia then bluntly and sharply dissected off the rectus muscle in superior and inferior fashion.  Rectus muscle was split in midline.  The parietal peritoneum was entered bluntly and extended.  The self- retaining Alexis retractor was placed and the bowels were displaced upwardly with  packings.  On initial entry of the abdomen, it was then noted that was obliteration of the posterior cul-de-sac and fibroid uterus. No evidence of a posterior uterine fibroid as per ultrasound. Bowels adherent to the posterior adnexa on both sides, right greater than the left. Fimbriated end which was blunted of the left tube noted and the ovary was superior in the left adnexa. The right ovary was adherent and underneath the bowels.  Anteriorly palpable multiple fibroids were  noted and further inspection was not feasible as the cul- de-sac posteriorly was obliterated.  At that point, intraoperative consultation from the general surgeon was obtained.  Please see his dictated operative report.  Once he completed his portion of the surgery, attention was then turned to perform myomectomy.  The left ovary did not displace but had no evidence of a cyst. The right ovary was slightly irregular with adhesions.  The anterior cul-de-sac did not show any lesions.  Posteriorly which was the anticipated location of the fibroid was palpable for multiple small fibroids and attention was therefore turned anteriorly trying to find the original reported 7 cm fibroid that was noted on ultrasound. At that point, exam suggested a possible fibroid in the lower uterine segment. An approach anteriorly was limited by lack of good visualization as much as lack of mobility of the uterus and narrow arch under the pubic bone in order to facilitate the procedure and be able to close the incision if excess bleeding occurs.  Given these issues,, decision was then made not to proceed as there was continued lack of mobility to the uterus posteriorly and the depth anteriorly would not allow for possibly having difficulty closing the defect if present. Bleeding on the left resulted in the left ureter being visualized and the left retroperitoneal space had been opened. Some exposure resulted in finally clamping of that bleeder and  free tying with 2-0 Vicryl suture.  When hemostasis was achieved, abdomen was irrigated and suctioned.  FloSeal was then placed posteriorly and the packings were removed.  The self-retaining retractor was removed. The parietal peritoneum was closed with 2-0 Vicryl.  The rectus fascia was closed with 0 Vicryl x2.  The subcutaneous area was irrigated, small bleeders cauterized. Interrupted 2-0 plain sutures were placed and the skin was approximated with 4-0 Vicryl subcuticular  sutures.  SPECIMEN:  Appendix.  ESTIMATED BLOOD LOSS:  1350 mL.  URINE OUTPUT:  200 mL.  INTRAOPERATIVE FLUID:  4800 mL.  COUNTS:  Sponge and instrument counts x2 was correct.  COMPLICATION:  None.  DISPOSITION:  The patient tolerated the procedure well and was transferred to recovery in stable condition.     Servando Salina, M.D.     [place]/MEDQ  D:  08/14/2015  T:  08/15/2015  Job:  [id number]

## 2015-08-15 NOTE — Progress Notes (Signed)
Subjective: Patient reports incisional pain, tolerating PO and no problems voiding no flatus. Concern about What pain med planned on discharfe.    Objective: I have reviewed patient's vital signs.  vital signs, intake and output and labs. Filed Vitals:   08/15/15 0456 08/15/15 0506  BP: 102/56   Pulse: 75   Temp: 97.8 F (36.6 C)   Resp: 18 18   I/O last 3 completed shifts: In: 5000 [I.V.:5000] Out: 1550 [Urine:200; Blood:1350] Total I/O In: 2377.2 [P.O.:830; I.V.:1547.2] Out: 2050 [Urine:2050]  Lab Results  Component Value Date   WBC 16.1* 08/14/2015   HGB 9.7* 08/14/2015   HCT 30.1* 08/14/2015   MCV 82.5 08/14/2015   PLT 215 08/14/2015   No results found for: CREATININE  EXAM General: alert, cooperative and no distress Resp: clear to auscultation bilaterally Cardio: regular rate and rhythm, S1, S2 normal, no murmur, click, rub or gallop GI: soft nondistended (+) BS. incision d/c/i Extremities: no edema, redness or tenderness in the calves or thighs Vaginal Bleeding: minimal  Assessment: s/p Procedure(s): APPENDECTOMY EXPLORATORY LAPAROTOMY WITH LYSIS OF ADHESIONS: stable, progressing well and anemia  Plan: Advance diet Encourage ambulation Discontinue IV fluids d/c pca. offer nicotine patch  LOS: 1 day    Amber Conley A, MD 08/15/2015 5:42 AM    08/15/2015, 5:42 AM

## 2015-08-16 NOTE — Progress Notes (Signed)
Patient asking for pain meds., recently medicated @ 1157.  Patient states tylenol #3 not working, " only takes the edge off."  [employer], CNM notified.

## 2015-08-16 NOTE — Progress Notes (Signed)
Patient ID: Amber Conley, female   DOB: [birth year], 34 y.o.   MRN: [id number] Subjective: S/P Appendectomy POD# 2  Reports feeling sore and gassy. Patient reports tolerating PO.  Pain minimally controlled with narcotic analgesics including Tylenol with codeine - requesting CNM to "ask Dr. Garwin Brothers what else I can take for pain?" Denies HA/SOB/C/P/N/V/dizziness. Flatus absent. No BM since bowel prep prior to surgery - "wonders if that is why no BM". She reports vaginal bleeding as normal, without clots.  She is ambulating, urinating without difficult.     Objective:   VS:  Filed Vitals:   08/15/15 1400 08/15/15 1830 08/15/15 2139 08/16/15 0627  BP: 120/79 123/85 119/72 111/75  Pulse: 64 90 76 93  Temp: 98.9 F (37.2 C) 98.1 F (36.7 C) 97.8 F (36.6 C) 98.8 F (37.1 C)  TempSrc: Oral Oral Oral Oral  Resp: 16 20 20 18   Height:      Weight:      SpO2: 100% 99% 100% 100%     Intake/Output Summary (Last 24 hours) at 08/16/15 1057 Last data filed at 08/15/15 2140  Gross per 24 hour  Intake    680 ml  Output   1325 ml  Net   -645 ml        Recent Labs  08/14/15 1759 08/15/15 0530  WBC 16.1* 9.6  HGB 9.7* 8.4*  HCT 30.1* 25.3*  PLT 215 171     Blood type: O POS (11/30 1520)     Physical Exam:   General: alert, cooperative and no distress   CV: RRR, no murmurs  Resp: inspiratory wheezes  Abdomen: soft, nontender, normal bowel sounds  Incision: clean, dry, intact and skin well-approximated with sutures  Vaginal Bleeding: minimal  Ext: extremities normal, atraumatic, no cyanosis or edema, Homans sign is negative, no sign of DVT and no edema, redness or tenderness in the calves or thighs   Assessment/Plan: 34 y.o.   POD# 2.  S/P Appendectomy.  Indications: acute appendicitis                Active Problems:   S/P appendectomy  Doing well, stable.               Regular diet as tolerated Ambulate Routine post-op care Encouraged to not go outside to smoke and use  Nicotine patch previously ordered Anticipate discharge home tomorrow  *Dr. Garwin Brothers notified of assessment / consulted on what to change pain medication to - orders received to change to Vicodin 1-2 tabs every 4 hrs prn / V.O. Given to Maudie Mercury, RN at [address] M, MSN, [place] 08/16/2015, 10:50 AM

## 2015-08-16 NOTE — Progress Notes (Signed)
Patient ID: Amber Conley, female   DOB: [birth year], 34 y.o.   MRN: [id number] 2 Days Post-Op  Subjective: Mild pain with activity.  Tol diet without pain. No BM yet  Objective: Vital signs in last 24 hours: Temp:  [97.8 F (36.6 C)-98.9 F (37.2 C)] 98.8 F (37.1 C) (12/03 0627) Pulse Rate:  [64-93] 93 (12/03 0627) Resp:  [16-20] 18 (12/03 0627) BP: (111-123)/(72-85) 111/75 mmHg (12/03 0627) SpO2:  [99 %-100 %] 100 % (12/03 0627) Last BM Date: 08/14/15  Intake/Output from previous day: 12/02 0701 - 12/03 0700 In: 790 [P.O.:790] Out: 1625 [Urine:1625] Intake/Output this shift:    General appearance: alert, cooperative and no distress GI: normal findings: soft, non-tender Incision/Wound: Clean and dry  Lab Results:   Recent Labs  08/14/15 1759 08/15/15 0530  WBC 16.1* 9.6  HGB 9.7* 8.4*  HCT 30.1* 25.3*  PLT 215 171   BMET  Recent Labs  08/15/15 0530  NA 138  K 3.9  CL 107  CO2 26  GLUCOSE 147*  BUN 9  CREATININE 0.79  CALCIUM 8.4*     Studies/Results: No results found.  Anti-infectives: Anti-infectives    Start     Dose/Rate Route Frequency Ordered Stop   08/14/15 1142  ceFAZolin (ANCEF) 2-3 GM-% IVPB SOLR    Comments:  Harvell, Gwendolyn  : cabinet override      08/14/15 1142 08/14/15 2344   08/14/15 0627  ceFAZolin (ANCEF) IVPB 2 g/50 mL premix     2 g 100 mL/hr over 30 Minutes Intravenous On call to O.R. 08/14/15 0627 08/14/15 1340      Assessment/Plan: s/p Procedure(s): APPENDECTOMY EXPLORATORY LAPAROTOMY WITH LYSIS OF ADHESIONS Doing well post op without apparent complications Anticipate discharge soon I will F/U with pt by phone on results of pathology on appendix    LOS: 2 days    Ivor Kishi T 08/16/2015

## 2015-08-17 MED ORDER — ALPRAZOLAM 0.25 MG PO TABS: 0.2500 mg | ORAL_TABLET | Freq: Three times a day (TID) | ORAL | Status: DC | PRN

## 2015-08-17 MED ORDER — ACETAMINOPHEN-CODEINE #3 300-30 MG PO TABS: 2.0000 | ORAL_TABLET | ORAL | Status: AC | PRN

## 2015-08-17 NOTE — Progress Notes (Signed)

## 2015-08-17 NOTE — Discharge Summary (Signed)
Physician Discharge Summary  Patient ID: Amber Conley MRN: [id number] DOB/AGE: [birth year] 34 y.o.  Admit date: 08/14/2015 Discharge date: 08/17/2015  Admission Diagnoses: DUB, bilateral complex ovarian cyst, dysmenorrhea, fibroid uterus  Discharge Diagnoses: Stage IV pelvic endometriosis, chronic appendicitis, fibroid uterus, IDA Active Problems:   S/P appendectomy   Discharged Condition: stable  Hospital Course: pt was admitted. She underwent exp lap, extensive lysis of adhesions, appy. Postoperative course was uneventful  Consults: general surgery  Significant Diagnostic Studies: labs:  CBC    Component Value Date/Time   WBC 9.6 08/15/2015 0530   RBC 3.09* 08/15/2015 0530   HGB 8.4* 08/15/2015 0530   HCT 25.3* 08/15/2015 0530   PLT 171 08/15/2015 0530   MCV 81.9 08/15/2015 0530   MCH 27.2 08/15/2015 0530   MCHC 33.2 08/15/2015 0530   RDW 13.9 08/15/2015 0530      Treatments: surgery: exp laparotomy, appendectomy, extensive lysis of adhesion  Discharge Exam: Blood pressure 100/55, pulse 82, temperature 99 F (37.2 C), temperature source Oral, resp. rate 18, height 5' 4.5" (1.638 m), weight 67.132 kg (148 lb), last menstrual period 08/12/2015, SpO2 99 %. General appearance: alert, cooperative and no distress Resp: clear to auscultation bilaterally Cardio: regular rate and rhythm, S1, S2 normal, no murmur, click, rub or gallop GI: soft, non-tender; bowel sounds normal; no masses,  no organomegaly Pelvic: deferred Extremities: no edema, redness or tenderness in the calves or thighs Incision/Wound: intact/clean/dry  Disposition: 01-Home or Self Care  Discharge Instructions    Call MD for:  persistant nausea and vomiting    Complete by:  As directed      Call MD for:  redness, tenderness, or signs of infection (pain, swelling, redness, odor or green/yellow discharge around incision site)    Complete by:  As directed      Call MD for:  temperature >100.4     Complete by:  As directed      Diet general    Complete by:  As directed      Discharge instructions    Complete by:  As directed   Call if temperature greater than equal to 100.4, nothing per vagina for 4-6 weeks or severe nausea vomiting, increased incisional pain , drainage or redness in the incision site, no straining with bowel movements, showers no bath     Discharge patient    Complete by:  As directed      Increase activity slowly    Complete by:  As directed      May walk up steps    Complete by:  As directed      Remove dressing in 48 hours    Complete by:  As directed             Medication List    TAKE these medications        acetaminophen-codeine 300-30 MG tablet  Commonly known as:  TYLENOL #3  Take 2 tablets by mouth every 4 (four) hours as needed for moderate pain.     ALPRAZolam 0.25 MG tablet  Commonly known as:  XANAX  Take 1 tablet (0.25 mg total) by mouth every 8 (eight) hours as needed for anxiety.           Follow-up Information    Follow up with Gila Lauf A, MD In 4 weeks.   Specialty:  Obstetrics and Gynecology   Contact information:   [address] [place] [address] [phone]       Signed: Servando Salina A 08/17/2015,  11:17 AM

## 2015-08-17 NOTE — Progress Notes (Signed)
Subjective: Patient reports tolerating PO and no problems voiding.    Objective: I have reviewed patient's vital signs.  vital signs, intake and output and medications. Filed Vitals:   08/16/15 2124 08/17/15 0537  BP: 135/89 100/55  Pulse: 93 82  Temp: 97.9 F (36.6 C) 99 F (37.2 C)  Resp: 20 18   I/O last 3 completed shifts: In: -  Out: 350 [Urine:350]    Lab Results  Component Value Date   WBC 9.6 08/15/2015   HGB 8.4* 08/15/2015   HCT 25.3* 08/15/2015   MCV 81.9 08/15/2015   PLT 171 08/15/2015   Lab Results  Component Value Date   CREATININE 0.79 08/15/2015    EXAM General: alert, cooperative and no distress Resp: clear to auscultation bilaterally Cardio: regular rate and rhythm, S1, S2 normal, no murmur, click, rub or gallop GI: soft, non-tender; bowel sounds normal; no masses,  no organomegaly and incision: clean, dry and intact Extremities: no edema, redness or tenderness in the calves or thighs Vaginal Bleeding: minimal Reviewed again the intraoperative events and the decision making based on the findings. Disc reasons for not proceeding with myomectomy. Also disc pelvic endometriosis mgmt including lupron. Not candidate for OCP due to smoker. Disc future surgery would entail gyn onc involvement due to the extensive adhesions. Path still pending Assessment: s/p Procedure(s): APPENDECTOMY EXPLORATORY LAPAROTOMY WITH LYSIS OF ADHESIONS: stable, tolerating diet and anemia  Plan: Encourage ambulation Discontinue IV fluids Discharge home  LOS: 3 days    Tkeyah Burkman A, MD 08/17/2015 11:11 AM    08/17/2015, 11:11 AM

## 2015-08-20 ENCOUNTER — Other Ambulatory Visit (HOSPITAL_COMMUNITY): Payer: Self-pay | Admitting: Obstetrics and Gynecology

## 2015-08-20 ENCOUNTER — Ambulatory Visit (HOSPITAL_COMMUNITY)
Admission: RE | Admit: 2015-08-20 | Discharge: 2015-08-20 | Disposition: A | Discharge status: Home / Self Care | Payer: BLUE CROSS/BLUE SHIELD | Source: Ambulatory Visit | Attending: Obstetrics and Gynecology | Admitting: Obstetrics and Gynecology

## 2015-08-20 DIAGNOSIS — M79662 Pain in left lower leg: Secondary | ICD-10-CM | POA: Diagnosis not present

## 2015-08-20 DIAGNOSIS — M7989 Other specified soft tissue disorders: Secondary | ICD-10-CM | POA: Insufficient documentation

## 2015-08-20 DIAGNOSIS — M79605 Pain in left leg: Secondary | ICD-10-CM

## 2015-08-20 NOTE — Progress Notes (Signed)
Preliminary results by tech - Left Lower Ext. Venous Duplex Completed. No Evidence of DVT or SVT thrombosis in the left leg.  Oda Cogan, BS, RDMS, RVT

## 2015-08-22 NOTE — Progress Notes (Signed)
Post discharge chart review completed.  

## 2015-08-23 LAB — TYPE AND SCREEN
ABO/RH(D): O POS
ANTIBODY SCREEN: NEGATIVE
UNIT DIVISION: 0
Unit division: 0

## 2016-05-13 ENCOUNTER — Encounter (HOSPITAL_COMMUNITY): Payer: Self-pay | Admitting: Emergency Medicine

## 2016-05-13 ENCOUNTER — Emergency Department (HOSPITAL_COMMUNITY)
Admission: EM | Admit: 2016-05-13 | Discharge: 2016-05-13 | Disposition: A | Discharge status: Home / Self Care | Payer: BLUE CROSS/BLUE SHIELD | Attending: Emergency Medicine | Admitting: Emergency Medicine

## 2016-05-13 DIAGNOSIS — Z79899 Other long term (current) drug therapy: Secondary | ICD-10-CM | POA: Insufficient documentation

## 2016-05-13 DIAGNOSIS — F17210 Nicotine dependence, cigarettes, uncomplicated: Secondary | ICD-10-CM | POA: Diagnosis not present

## 2016-05-13 DIAGNOSIS — R1013 Epigastric pain: Secondary | ICD-10-CM | POA: Diagnosis present

## 2016-05-13 DIAGNOSIS — K2970 Gastritis, unspecified, without bleeding: Secondary | ICD-10-CM | POA: Diagnosis not present

## 2016-05-13 LAB — COMPREHENSIVE METABOLIC PANEL
ALT: 12 U/L — ABNORMAL LOW (ref 14–54)
ANION GAP: 7 (ref 5–15)
AST: 16 U/L (ref 15–41)
Albumin: 4 g/dL (ref 3.5–5.0)
Alkaline Phosphatase: 42 U/L (ref 38–126)
BUN: 11 mg/dL (ref 6–20)
CALCIUM: 9.2 mg/dL (ref 8.9–10.3)
CHLORIDE: 105 mmol/L (ref 101–111)
CO2: 24 mmol/L (ref 22–32)
Creatinine, Ser: 0.87 mg/dL (ref 0.44–1.00)
GFR calc non Af Amer: >60 mL/min (ref >60)
Glucose, Bld: 99 mg/dL (ref 65–99)
POTASSIUM: 3.7 mmol/L (ref 3.5–5.1)
SODIUM: 136 mmol/L (ref 135–145)
Total Bilirubin: 0.7 mg/dL (ref 0.3–1.2)
Total Protein: 7 g/dL (ref 6.5–8.1)

## 2016-05-13 LAB — PREGNANCY, URINE: Preg Test, Ur: NEGATIVE

## 2016-05-13 LAB — URINALYSIS, ROUTINE W REFLEX MICROSCOPIC
Glucose, UA: NEGATIVE mg/dL
Ketones, ur: >80 mg/dL — AB
Leukocytes, UA: NEGATIVE
Nitrite: NEGATIVE
Protein, ur: NEGATIVE mg/dL
SPECIFIC GRAVITY, URINE: 1.024 (ref 1.005–1.030)
pH: 5.5 (ref 5.0–8.0)

## 2016-05-13 LAB — CBC
HEMATOCRIT: 35.7 % — AB (ref 36.0–46.0)
HEMOGLOBIN: 11.9 g/dL — AB (ref 12.0–15.0)
MCH: 26.4 pg (ref 26.0–34.0)
MCHC: 33.3 g/dL (ref 30.0–36.0)
MCV: 79.2 fL (ref 78.0–100.0)
Platelets: 231 10*3/uL (ref 150–400)
RBC: 4.51 MIL/uL (ref 3.87–5.11)
RDW: 14.7 % (ref 11.5–15.5)
WBC: 6.7 10*3/uL (ref 4.0–10.5)

## 2016-05-13 LAB — POC OCCULT BLOOD, ED: Fecal Occult Bld: NEGATIVE

## 2016-05-13 LAB — URINE MICROSCOPIC-ADD ON

## 2016-05-13 LAB — LIPASE, BLOOD: LIPASE: 21 U/L (ref 11–51)

## 2016-05-13 MED ORDER — FAMOTIDINE IN NACL 20-0.9 MG/50ML-% IV SOLN
20.0000 mg | Freq: Once | INTRAVENOUS | Status: AC
  Administered 2016-05-13: 20 mg via INTRAVENOUS
  Filled 2016-05-13: qty 50

## 2016-05-13 MED ORDER — ONDANSETRON HCL 4 MG PO TABS: 4.0000 mg | ORAL_TABLET | Freq: Three times a day (TID) | ORAL | 0 refills | Status: DC | PRN

## 2016-05-13 MED ORDER — SUCRALFATE 1 GM/10ML PO SUSP: 1.0000 g | Freq: Three times a day (TID) | ORAL | 0 refills | Status: DC

## 2016-05-13 MED ORDER — SODIUM CHLORIDE 0.9 % IV BOLUS (SEPSIS)
1000.0000 mL | Freq: Once | INTRAVENOUS | Status: AC
  Administered 2016-05-13: 1000 mL via INTRAVENOUS

## 2016-05-13 MED ORDER — FAMOTIDINE 20 MG PO TABS
20.0000 mg | ORAL_TABLET | Freq: Two times a day (BID) | ORAL | 0 refills | Status: DC
Start: 2016-05-13 — End: 2018-05-24

## 2016-05-13 NOTE — ED Provider Notes (Signed)
Amber Conley Provider Note   CSN: [id number] Arrival date & time: 05/13/16  1639     History   Chief Complaint Chief Complaint  Patient presents with  . Abdominal Pain    HPI  Blood pressure 135/80, pulse 64, temperature 98.9 F (37.2 C), temperature source Oral, resp. rate 18, height 5\' 4"  (1.626 m), weight 61.7 kg, last menstrual period 05/13/2016, SpO2 100 %.  Amber Conley is a 35 y.o. female complaining of multiple episodes of nonbloody, nonbilious, no coffee-ground emesis onset 2 weeks ago and associated with melanotic and grossly bloody stool. Patient has just come from urgent care which she was given a shot for nausea and she vomited afterwards. She reports a mild epigastric abdominal pain really only prior to the emesis. She doesn't take any NSAIDs however she did take 800 mg ibuprofen regularly but she stopped this 2 weeks ago. Denies marijuana and alcohol use. Patient denies dysuria, hematuria, fever, chills, abnormal vaginal discharge, chest pain, shortness of breath.  [facility]  HPI  Past Medical History:  Diagnosis Date  . Dysrhythmia    fast heart rate after taking tylenol with codeine  . Endometriosis   . Goiter     Patient Active Problem List   Diagnosis Date Noted  . S/P appendectomy 08/14/2015    Past Surgical History:  Procedure Laterality Date  . APPENDECTOMY N/A 08/14/2015   Procedure: APPENDECTOMY;  Surgeon: Servando Salina, MD;  Location: [facility];  Service: Gynecology;  Laterality: N/A;  . LAPAROTOMY N/A 08/14/2015   Procedure: EXPLORATORY LAPAROTOMY WITH LYSIS OF ADHESIONS;  Surgeon: Servando Salina, MD;  Location: [facility];  Service: Gynecology;  Laterality: N/A;    OB History    Gravida Para Term Preterm AB Living   0 0 0 0 0 0   SAB TAB Ectopic Multiple Live Births   0 0 0 0         Home Medications    Prior to Admission medications   Medication Sig Start Date End Date Taking? Authorizing Provider  folic acid (FOLVITE) 1  MG tablet Take 1 mg by mouth daily.   Yes Historical Provider, MD  acetaminophen-codeine (TYLENOL #3) 300-30 MG tablet Take 2 tablets by mouth every 4 (four) hours as needed for moderate pain. 08/17/15   Servando Salina, MD  ALPRAZolam Duanne Moron) 0.25 MG tablet Take 1 tablet (0.25 mg total) by mouth every 8 (eight) hours as needed for anxiety. Patient not taking: Reported on 05/13/2016 08/17/15   Servando Salina, MD  famotidine (PEPCID) 20 MG tablet Take 1 tablet (20 mg total) by mouth 2 (two) times daily. 05/13/16   Darleny Sem, PA-C  ondansetron (ZOFRAN) 4 MG tablet Take 1 tablet (4 mg total) by mouth every 8 (eight) hours as needed for nausea or vomiting. 05/13/16   Elmyra Ricks Kynnedy Carreno, PA-C  sucralfate (CARAFATE) 1 GM/10ML suspension Take 10 mLs (1 g total) by mouth 4 (four) times daily -  with meals and at bedtime. 05/13/16   Elmyra Ricks Hanna Ra, PA-C    Family History No family history on file.  Social History Social History  Substance Use Topics  . Smoking status: Current Every Day Smoker    Packs/day: 1.00    Years: 17.00    Types: Cigarettes  . Smokeless tobacco: Never Used  . Alcohol use No     Allergies   Ponstel [mefenamic acid]   Review of Systems Review of Systems  10 systems reviewed and found to be negative, except as noted in the  HPI.   Physical Exam Updated Vital Signs BP 114/70 (BP Location: Right Arm)   Pulse 67   Temp 98.9 F (37.2 C) (Oral)   Resp 18   Ht 5\' 4"  (1.626 m)   Wt 61.7 kg   LMP 05/13/2016   SpO2 100%   BMI 23.34 kg/m   Physical Exam  Constitutional: She is oriented to person, place, and time. She appears well-developed and well-nourished. No distress.  HENT:  Head: Normocephalic and atraumatic.  Mouth/Throat: Oropharynx is clear and moist.  Eyes: Conjunctivae and EOM are normal. Pupils are equal, round, and reactive to light.  Neck: Normal range of motion.  Cardiovascular: Normal rate, regular rhythm and intact distal pulses.     Pulmonary/Chest: Effort normal and breath sounds normal.  Abdominal: Soft. She exhibits no distension and no mass. There is tenderness. There is no rebound and no guarding. No hernia.  L tenderness in the epigastrium with no guarding or rebound.  Genitourinary:  Genitourinary Comments: Digital rectal exam a chaperoned by PA students, normal rectal tone, normal stool color.  Musculoskeletal: Normal range of motion.  Neurological: She is alert and oriented to person, place, and time.  Skin: She is not diaphoretic.  Psychiatric: She has a normal mood and affect.  Nursing note and vitals reviewed.    ED Treatments / Results  Labs (all labs ordered are listed, but only abnormal results are displayed) Labs Reviewed  COMPREHENSIVE METABOLIC PANEL - Abnormal; Notable for the following:       Result Value   ALT 12 (*)    All other components within normal limits  CBC - Abnormal; Notable for the following:    Hemoglobin 11.9 (*)    HCT 35.7 (*)    All other components within normal limits  URINALYSIS, ROUTINE W REFLEX MICROSCOPIC (NOT AT [facility]) - Abnormal; Notable for the following:    Hgb urine dipstick LARGE (*)    Bilirubin Urine SMALL (*)    Ketones, ur >80 (*)    All other components within normal limits  URINE MICROSCOPIC-ADD ON - Abnormal; Notable for the following:    Squamous Epithelial / LPF 0-5 (*)    Bacteria, UA RARE (*)    All other components within normal limits  LIPASE, BLOOD  PREGNANCY, URINE  POC URINE PREG, ED  POC OCCULT BLOOD, ED  POC OCCULT BLOOD, ED    EKG  EKG Interpretation None       Radiology No results found.  Procedures Procedures (including critical care time)  Medications Ordered in ED Medications  sodium chloride 0.9 % bolus 1,000 mL (1,000 mLs Intravenous New Bag/Given 05/13/16 2055)  famotidine (PEPCID) IVPB 20 mg premix (not administered)     Initial Impression / Assessment and Plan / ED Course  I have reviewed the triage vital  signs and the nursing notes.  Pertinent labs & imaging results that were available during my care of the patient were reviewed by me and considered in my medical decision making (see chart for details).  Clinical Course    Vitals:   05/13/16 1704 05/13/16 1717 05/13/16 1903  BP: 135/80  114/70  Pulse: 64  67  Resp: 18  18  Temp: 98.9 F (37.2 C)    TempSrc: Oral    SpO2: 100%  100%  Weight:  61.7 kg   Height:  5\' 4"  (1.626 m)     Medications  sodium chloride 0.9 % bolus 1,000 mL (1,000 mLs Intravenous New Bag/Given 05/13/16 2055)  famotidine (PEPCID) IVPB 20 mg premix (not administered)    Kennadee Kulikowski is 35 y.o. female presenting with Intermittent emesis over the course of 2 weeks, she does have abdominal discomfort, I think this is from a gastritis that was probably induced by NSAIDs. Abdominal exam is nonsurgical, blood work is reassuring with no AKI. Urinalysis does have ketones, patient is tolerating by mouth in the ED, will give fluid bolus and recommend close follow-up with gastroenterology, we've had an extensive discussion of return precautions and patient verbalizes understanding.  Evaluation does not show pathology that would require ongoing emergent intervention or inpatient treatment. Pt is hemodynamically stable and mentating appropriately. Discussed findings and plan with patient/guardian, who agrees with care plan. All questions answered. Return precautions discussed and outpatient follow up given.      Final Clinical Impressions(s) / ED Diagnoses   Final diagnoses:  Gastritis    New Prescriptions New Prescriptions   FAMOTIDINE (PEPCID) 20 MG TABLET    Take 1 tablet (20 mg total) by mouth 2 (two) times daily.   ONDANSETRON (ZOFRAN) 4 MG TABLET    Take 1 tablet (4 mg total) by mouth every 8 (eight) hours as needed for nausea or vomiting.   SUCRALFATE (CARAFATE) 1 GM/10ML SUSPENSION    Take 10 mLs (1 g total) by mouth 4 (four) times daily -  with meals and at  bedtime.     Monico Blitz, PA-C 05/13/16 2112    Varney Biles, MD 05/14/16 [phone]

## 2016-05-13 NOTE — ED Triage Notes (Signed)
Pt presenting with abdominal pain, nausea and vomiting x1 week. Denies diarrhea. Was seen at [facility] prior to arrival and encouraged to come to the ED for further evaluation.

## 2016-05-13 NOTE — ED Notes (Signed)
Patient tolerating ginger ale well

## 2016-05-13 NOTE — Progress Notes (Signed)
Patient listed as having [place] insurance without a pcp.  EDCM spoke to patient at bedside.  Patient reports she has received pcp referrals in the mail.  She has just moved into this area.  Female at bedside requesting list of providers who accept [facility] insurance for patient.  EDCM provided patient with list of providers who accept [employer] insurance within a 15 mile radius of patient's zip code [phone].  Patient thankful for services.  No further EDCM needs at this time.

## 2016-05-13 NOTE — Discharge Instructions (Signed)
You need to drink plenty of water and have this rechecked by your primary care doctor in the next week. Do not take any NSAIDs (motrin, ibuprofen, advil, aleve, excedrin, aspirin, naproxen, goody's powder,  etc.) because they can further irritate your stomach .  Push fluids: take small frequent sips of water or Gatorade, do not drink any soda, juice or caffeinated beverages.    Slowly resume solid diet as desired. Avoid food that are spicy, contain dairy and/or have high fat content.  Return to the emergency room for severely worsening abdominal pain, abdominal pain that localizes to a particular area (especially the right lower part of the belly), pain that persists past 8-10 hours, blood in stool or vomit, severe weakness, fainting, or fever.   Maintain hydration by drinking small amounts of clear fluids frequently, then soft diet, and then advance to a solid diet as tolerated. Avoid foods that are spicy, high in fat or dairy.

## 2016-05-13 NOTE — ED Notes (Signed)
PA-C at bedside 

## 2016-06-04 ENCOUNTER — Ambulatory Visit
Admission: RE | Admit: 2016-06-04 | Discharge: 2016-06-04 | Disposition: A | Discharge status: Home / Self Care | Payer: BLUE CROSS/BLUE SHIELD | Source: Ambulatory Visit | Attending: Obstetrics and Gynecology | Admitting: Obstetrics and Gynecology

## 2016-06-04 ENCOUNTER — Other Ambulatory Visit: Payer: Self-pay | Admitting: Obstetrics and Gynecology

## 2016-06-04 DIAGNOSIS — R634 Abnormal weight loss: Secondary | ICD-10-CM

## 2016-06-04 DIAGNOSIS — F17200 Nicotine dependence, unspecified, uncomplicated: Secondary | ICD-10-CM

## 2017-05-03 IMAGING — CR DG CHEST 2V
2 series · 2 of 2 positions shown · non-contrast
Comparison: None.

CLINICAL DATA: Weight loss.  Smoker.

EXAM:
CHEST  2 VIEW

[w chest pa]
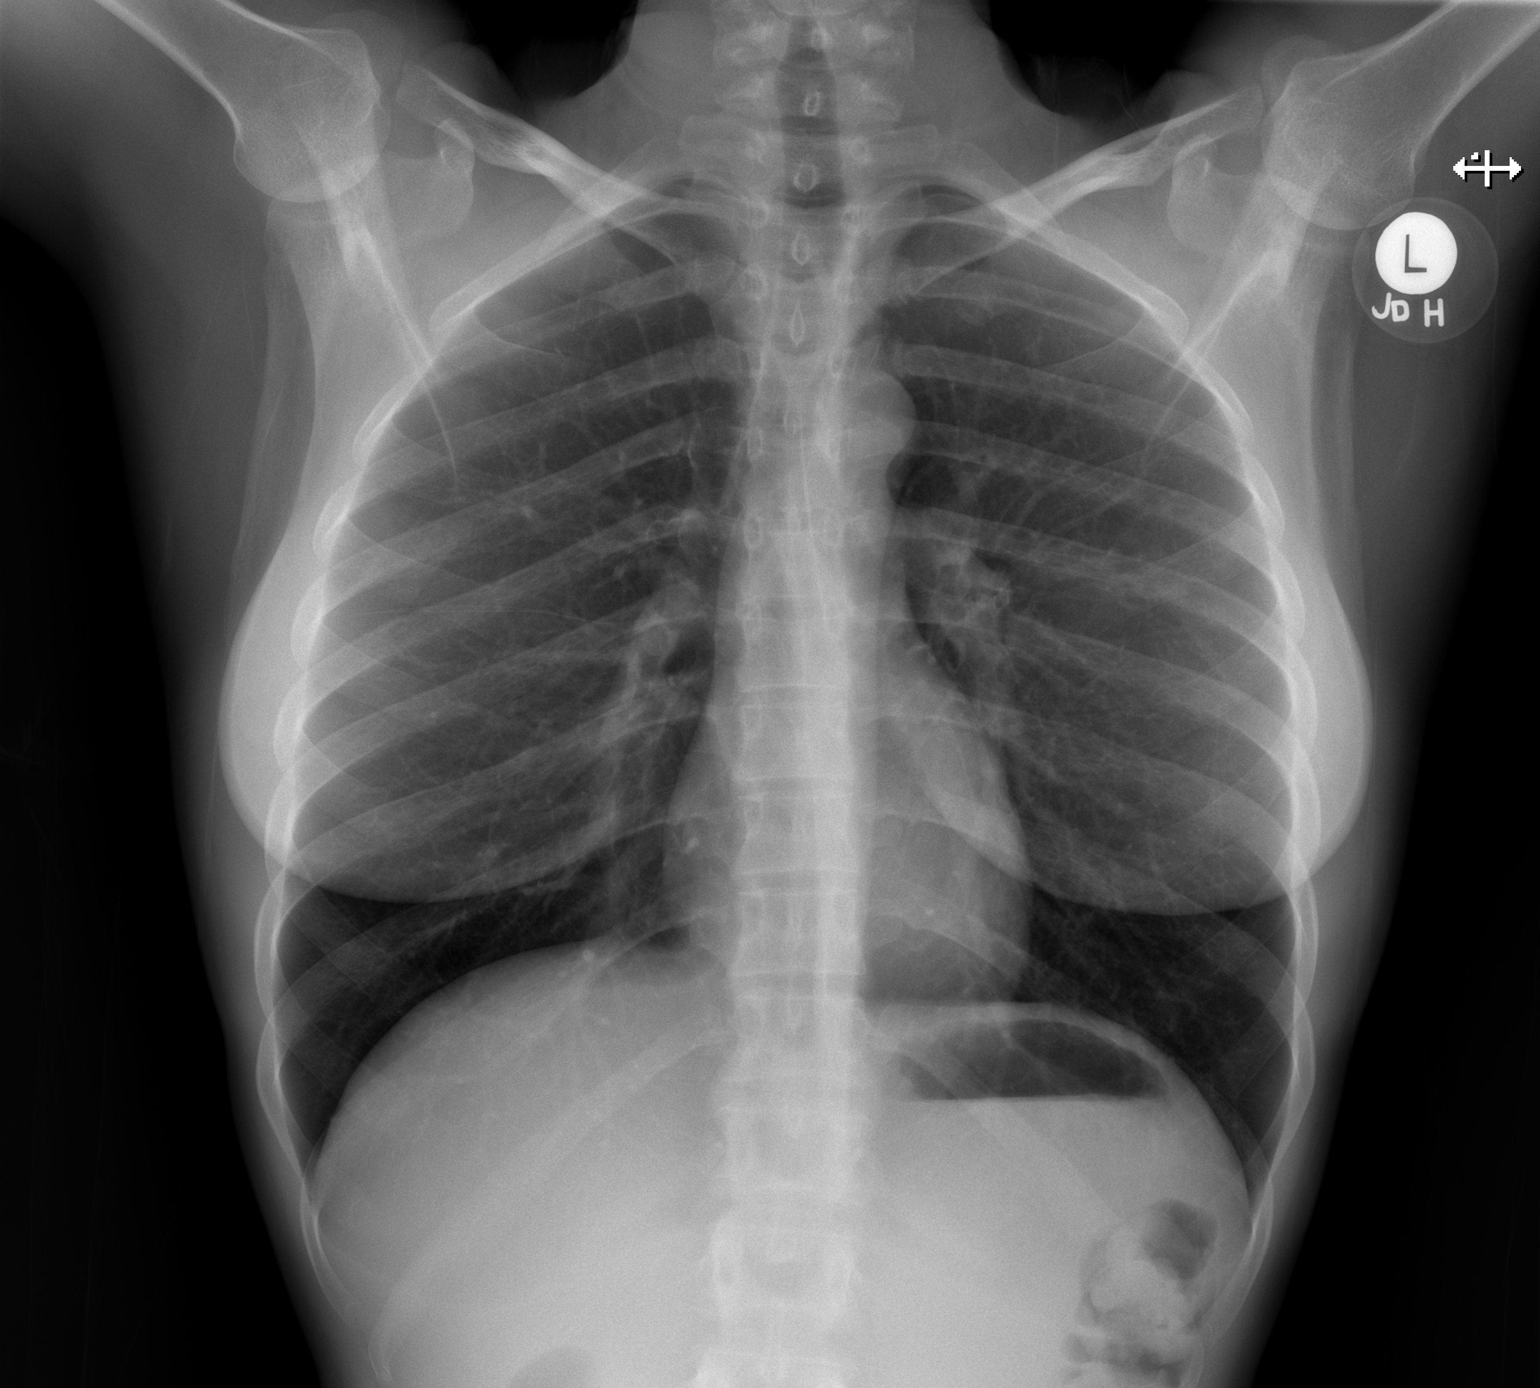

[w chest lat]
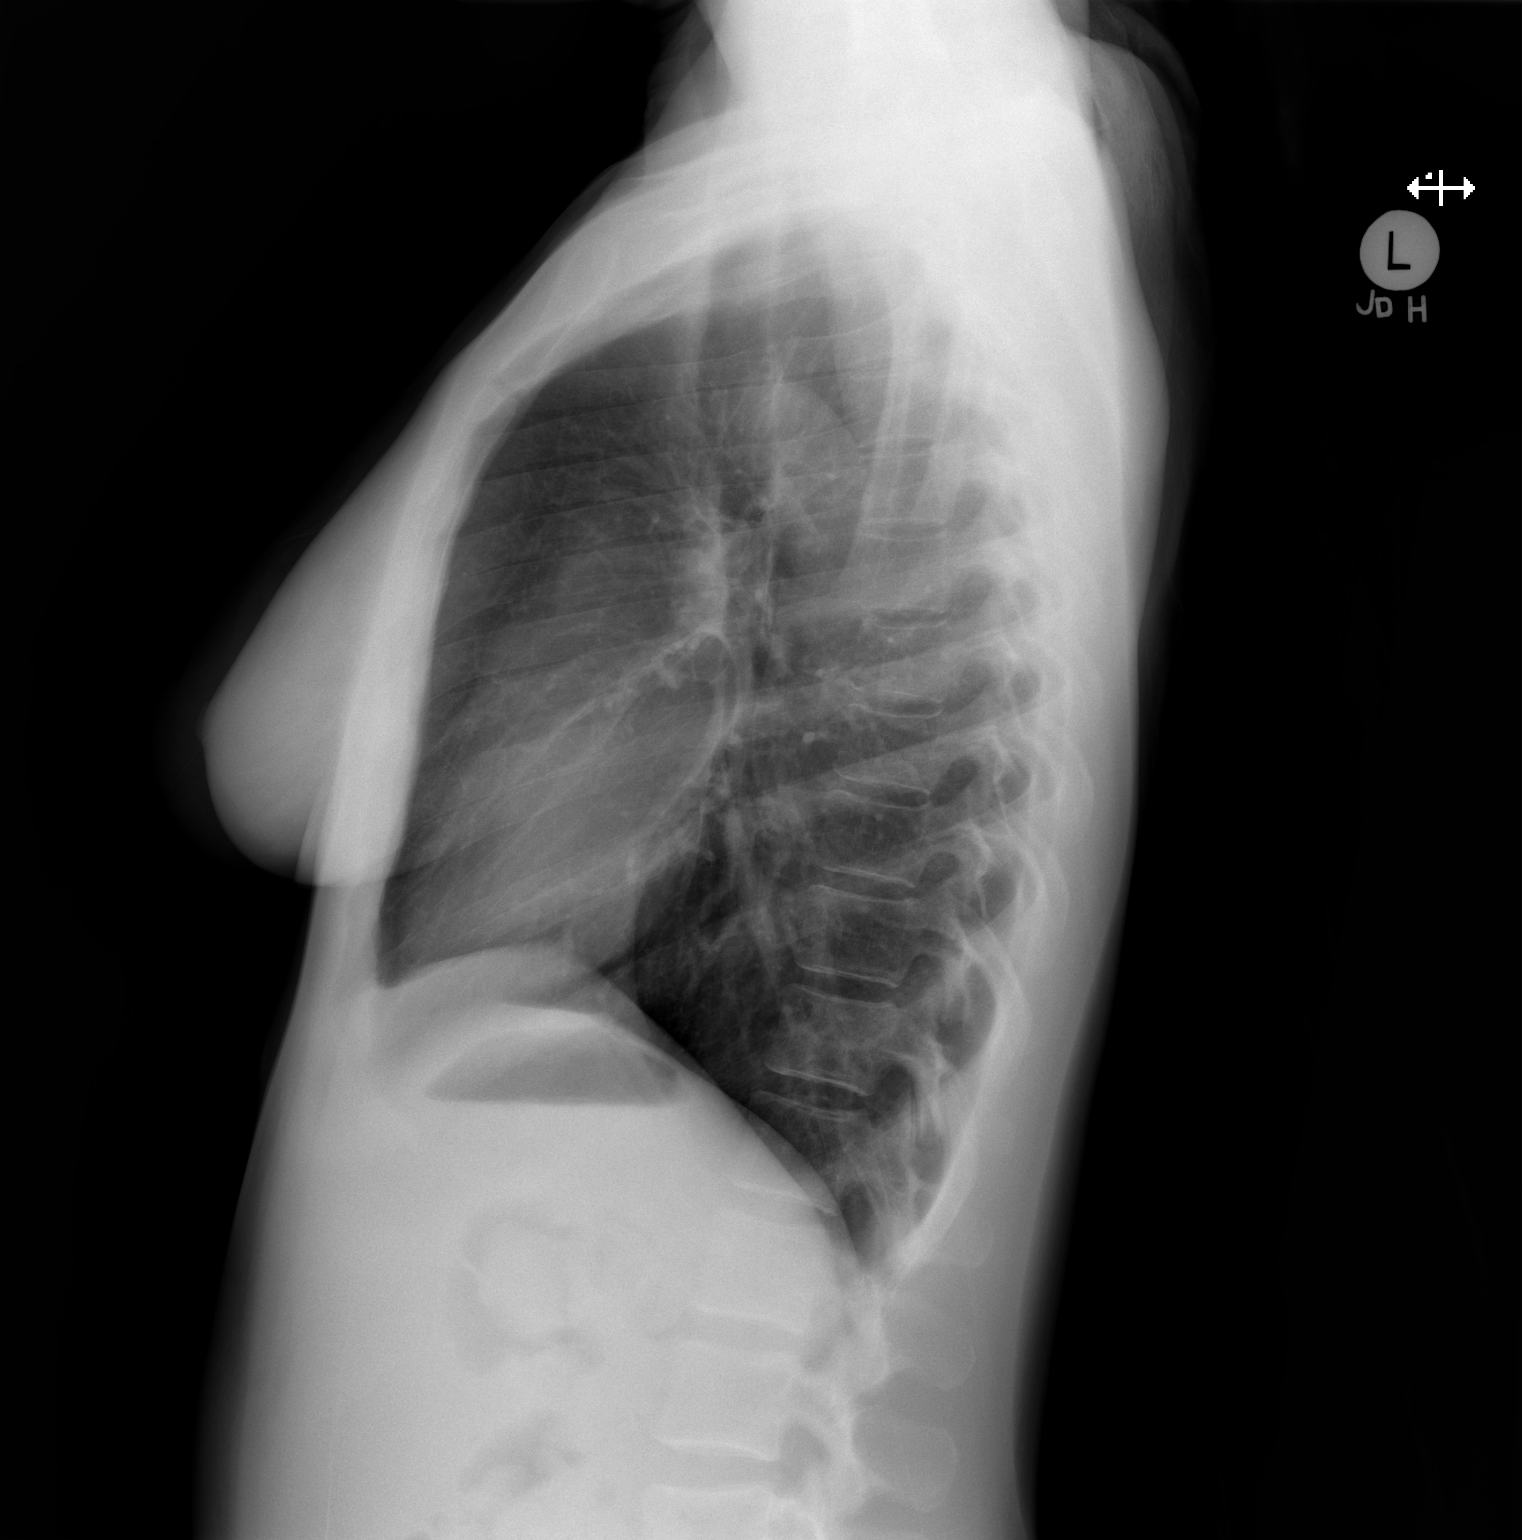

[2 of 2 positions shown; findings below may reference images not displayed]

FINDINGS: The heart size and mediastinal contours are within normal limits.
Both lungs are clear. No evidence of pulmonary opacity or pleural
effusion. The visualized skeletal structures are unremarkable.
IMPRESSION: Negative.  No active cardiopulmonary disease.

## 2018-05-24 ENCOUNTER — Emergency Department (HOSPITAL_COMMUNITY)
Admission: EM | Admit: 2018-05-24 | Discharge: 2018-05-24 | Disposition: A | Discharge status: Home / Self Care | Payer: 59 | Attending: Emergency Medicine | Admitting: Emergency Medicine

## 2018-05-24 ENCOUNTER — Encounter (HOSPITAL_COMMUNITY): Payer: Self-pay

## 2018-05-24 ENCOUNTER — Emergency Department (HOSPITAL_COMMUNITY): Payer: 59

## 2018-05-24 ENCOUNTER — Other Ambulatory Visit: Payer: Self-pay

## 2018-05-24 DIAGNOSIS — Z79899 Other long term (current) drug therapy: Secondary | ICD-10-CM | POA: Diagnosis not present

## 2018-05-24 DIAGNOSIS — R079 Chest pain, unspecified: Secondary | ICD-10-CM | POA: Diagnosis not present

## 2018-05-24 DIAGNOSIS — F17210 Nicotine dependence, cigarettes, uncomplicated: Secondary | ICD-10-CM | POA: Diagnosis not present

## 2018-05-24 LAB — CBC WITH DIFFERENTIAL/PLATELET
Basophils Absolute: 0 10*3/uL (ref 0.0–0.1)
Basophils Relative: 0 %
Eosinophils Absolute: 0.1 10*3/uL (ref 0.0–0.7)
Eosinophils Relative: 2 %
HCT: 39.1 % (ref 36.0–46.0)
Hemoglobin: 12.8 g/dL (ref 12.0–15.0)
Lymphocytes Relative: 24 %
Lymphs Abs: 2.3 10*3/uL (ref 0.7–4.0)
MCH: 27.4 pg (ref 26.0–34.0)
MCHC: 32.7 g/dL (ref 30.0–36.0)
MCV: 83.5 fL (ref 78.0–100.0)
Monocytes Absolute: 0.4 10*3/uL (ref 0.1–1.0)
Monocytes Relative: 4 %
Neutro Abs: 6.6 10*3/uL (ref 1.7–7.7)
Neutrophils Relative %: 70 %
Platelets: 277 10*3/uL (ref 150–400)
RBC: 4.68 MIL/uL (ref 3.87–5.11)
RDW: 14.3 % (ref 11.5–15.5)
WBC: 9.4 10*3/uL (ref 4.0–10.5)

## 2018-05-24 LAB — BASIC METABOLIC PANEL
Anion gap: 10 (ref 5–15)
BUN: 8 mg/dL (ref 6–20)
CO2: 23 mmol/L (ref 22–32)
Calcium: 9.1 mg/dL (ref 8.9–10.3)
Chloride: 110 mmol/L (ref 98–111)
Creatinine, Ser: 0.77 mg/dL (ref 0.44–1.00)
GFR calc Af Amer: >60 mL/min (ref >60)
GFR calc non Af Amer: >60 mL/min (ref >60)
Glucose, Bld: 91 mg/dL (ref 70–99)
Potassium: 3.6 mmol/L (ref 3.5–5.1)
Sodium: 143 mmol/L (ref 135–145)

## 2018-05-24 LAB — TROPONIN I: Troponin I: <0.03 ng/mL (ref <0.03)

## 2018-05-24 NOTE — ED Notes (Signed)
Patient transported to X-ray 

## 2018-05-24 NOTE — ED Triage Notes (Signed)
Pt states she has been having left sided chest pain for a few days. Pt states that it was "severe" today and became concerned.

## 2018-05-24 NOTE — ED Provider Notes (Signed)
[facility] DEPT Provider Note   CSN: [id number] Arrival date & time: 05/24/18  1740     History   Chief Complaint Chief Complaint  Patient presents with  . Chest Pain    HPI Amber Conley is a 37 y.o. female.  HPI  37 year old female with chest pain.  Onset 3 days ago.  She she cannot remember she is doing at symptom onset.  She describes pain in the center to left side of her chest.  Constant.  Feels like it is progressed to become more intense.  No change with exertion.  Denies any respiratory symptoms.  Unusual leg pain or swelling.  She is a smoker and symptoms concerning enough to decide to stop.  Past Medical History:  Diagnosis Date  . Dysrhythmia    fast heart rate after taking tylenol with codeine  . Endometriosis   . Goiter     Patient Active Problem List   Diagnosis Date Noted  . S/P appendectomy 08/14/2015    Past Surgical History:  Procedure Laterality Date  . APPENDECTOMY N/A 08/14/2015   Procedure: APPENDECTOMY;  Surgeon: Servando Salina, MD;  Location: [facility];  Service: Gynecology;  Laterality: N/A;  . LAPAROTOMY N/A 08/14/2015   Procedure: EXPLORATORY LAPAROTOMY WITH LYSIS OF ADHESIONS;  Surgeon: Servando Salina, MD;  Location: [facility];  Service: Gynecology;  Laterality: N/A;     OB History    Gravida  0   Para  0   Term  0   Preterm  0   AB  0   Living  0     SAB  0   TAB  0   Ectopic  0   Multiple  0   Live Births               Home Medications    Prior to Admission medications   Medication Sig Start Date End Date Taking? Authorizing Provider  acetaminophen-codeine (TYLENOL #3) 300-30 MG tablet Take 2 tablets by mouth every 4 (four) hours as needed for moderate pain. 08/17/15  Yes Servando Salina, MD  ALPRAZolam Duanne Moron) 0.25 MG tablet Take 1 tablet (0.25 mg total) by mouth every 8 (eight) hours as needed for anxiety. Patient not taking: Reported on 05/13/2016 08/17/15   Servando Salina, MD  famotidine (PEPCID) 20 MG tablet Take 1 tablet (20 mg total) by mouth 2 (two) times daily. 05/13/16   Pisciotta, Elmyra Ricks, PA-C  folic acid (FOLVITE) 1 MG tablet Take 1 mg by mouth daily.    [provider]  ondansetron (ZOFRAN) 4 MG tablet Take 1 tablet (4 mg total) by mouth every 8 (eight) hours as needed for nausea or vomiting. 05/13/16   Pisciotta, Elmyra Ricks, PA-C  sucralfate (CARAFATE) 1 GM/10ML suspension Take 10 mLs (1 g total) by mouth 4 (four) times daily -  with meals and at bedtime. 05/13/16   Pisciotta, Charna Elizabeth    Family History No family history on file.  Social History Social History   Tobacco Use  . Smoking status: Current Every Day Smoker    Packs/day: 1.00    Years: 17.00    Pack years: 17.00    Types: Cigarettes  . Smokeless tobacco: Never Used  Substance Use Topics  . Alcohol use: No  . Drug use: No     Allergies   Ponstel [mefenamic acid]   Review of Systems Review of Systems  All systems reviewed and negative, other than as noted in HPI.  Physical Exam Updated  Vital Signs BP (!) 149/108 (BP Location: Left Arm)   Pulse 81   Temp 98.6 F (37 C) (Oral)   Resp 18   Ht 5\' 4"  (1.626 m)   Wt 78 kg   LMP 05/17/2018   SpO2 100%   BMI 29.52 kg/m   Physical Exam  Constitutional: She appears well-developed and well-nourished. No distress.  HENT:  Head: Normocephalic and atraumatic.  Eyes: Conjunctivae are normal. Right eye exhibits no discharge. Left eye exhibits no discharge.  Neck: Neck supple.  Cardiovascular: Normal rate, regular rhythm and normal heart sounds. Exam reveals no gallop and no friction rub.  No murmur heard. Pulmonary/Chest: Effort normal and breath sounds normal. No respiratory distress.  Abdominal: Soft. She exhibits no distension. There is no tenderness.  Musculoskeletal: She exhibits no edema or tenderness.  Lower extremities symmetric as compared to each other. No calf tenderness. Negative Homan's. No  palpable cords.   Neurological: She is alert.  Skin: Skin is warm and dry.  Psychiatric: She has a normal mood and affect. Her behavior is normal. Thought content normal.  Nursing note and vitals reviewed.    ED Treatments / Results  Labs (all labs ordered are listed, but only abnormal results are displayed) Labs Reviewed  BASIC METABOLIC PANEL  CBC WITH DIFFERENTIAL/PLATELET  TROPONIN I    EKG EKG Interpretation  Date/Time:  Wednesday May 24 2018 09:20:53 EDT Ventricular Rate:  79 PR Interval:    QRS Duration: 90 QT Interval:  375 QTC Calculation: 430 R Axis:   59 Text Interpretation:  Sinus rhythm Non-specific ST-t changes No old tracing to compare Confirmed by Virgel Manifold [phone]) on 05/24/2018 9:28:36 AM   Radiology Dg Chest 2 View  Result Date: 05/24/2018 CLINICAL DATA:  Left chest and shoulder pain EXAM: CHEST - 2 VIEW COMPARISON:  06/04/2016 FINDINGS: The lungs are clear without focal pneumonia, edema, pneumothorax or pleural effusion. The cardiopericardial silhouette is within normal limits for size. The visualized bony structures of the thorax are intact. Telemetry leads overlie the chest. IMPRESSION: No active cardiopulmonary disease. Electronically Signed   By: Misty Stanley M.D.   On: 05/24/2018 10:21    Procedures Procedures (including critical care time)  Medications Ordered in ED Medications - No data to display   Initial Impression / Assessment and Plan / ED Course  I have reviewed the triage vital signs and the nursing notes.  Pertinent labs & imaging results that were available during my care of the patient were reviewed by me and considered in my medical decision making (see chart for details).     37 year old female with chest pain.  Sounds low risk.  Atypical for ACS given constant duration for several days.  EKG with nonspecific changes.  No old for comparison.  She has no respiratory complaints.  She sounds clear on exam.  Will obtain  some screening studies.  Overall, suspicion for emergent process such as PE, pneumonia, pneumothorax, etc is low.   W/u unrevealing.   I have reviewed the triage vital signs and the nursing notes. Prior records were reviewed for additional information.    Pertinent labs & imaging results that were available during my care of the patient were reviewed by me and considered in my medical decision making (see chart for details).   Final Clinical Impressions(s) / ED Diagnoses   Final diagnoses:  Nonspecific chest pain    ED Discharge Orders    None       Virgel Manifold, MD 05/24/18  1059  

## 2020-06-06 ENCOUNTER — Other Ambulatory Visit: Payer: Self-pay | Admitting: Obstetrics and Gynecology

## 2020-06-06 DIAGNOSIS — E049 Nontoxic goiter, unspecified: Secondary | ICD-10-CM
